# Patient Record
Sex: Female | Born: 1965 | Race: Black or African American | Hispanic: No
Health system: Southern US, Community
[De-identification: ages and names within clinical notes are randomized; demographics above are authoritative.]

---

## 2007-09-15 ENCOUNTER — Inpatient Hospital Stay
Admission: EM | Admit: 2007-09-15 | Disposition: A | Discharge status: Home / Self Care | Payer: Self-pay | Source: Emergency Department | Admitting: Emergency Medical Services

## 2007-09-15 LAB — CBC AND DIFFERENTIAL
Basophils Absolute: 0 /mm3 (ref 0.0–0.2)
Basophils: 0 % (ref 0–2)
Eosinophils Absolute: 0.1 /mm3 (ref 0.0–0.7)
Eosinophils: 1 % (ref 0–5)
Granulocytes Absolute: 3 /mm3 (ref 1.8–8.1)
Hematocrit: 40.2 % (ref 37.0–47.0)
Hgb: 13.2 G/DL (ref 12.0–16.0)
Immature Granulocytes Absolute: 0 CUMM (ref 0.0–0.0)
Immature Granulocytes: 0 % (ref 0–1)
Lymphocytes Absolute: 1 /mm3 (ref 0.5–4.4)
Lymphocytes: 22 % (ref 15–41)
MCH: 28 PG (ref 28.0–32.0)
MCHC: 32.8 G/DL (ref 32.0–36.0)
MCV: 85.2 FL (ref 80.0–100.0)
MPV: 9.9 FL (ref 9.4–12.3)
Monocytes Absolute: 0.3 /mm3 (ref 0.0–1.2)
Monocytes: 7 % (ref 0–11)
Neutrophils %: 69 % (ref 52–75)
Platelets: 230 /mm3 (ref 140–400)
RBC: 4.72 /mm3 (ref 4.20–5.40)
RDW: 14.1 % (ref 11.5–15.0)
WBC: 4.25 /mm3 (ref 3.50–10.80)

## 2007-09-15 LAB — BASIC METABOLIC PANEL
BUN: 12 MG/DL (ref 7–21)
CO2: 29 MEQ/L (ref 22–31)
Calcium: 9.3 MG/DL (ref 8.6–10.2)
Chloride: 102 MEQ/L (ref 98–107)
Creatinine: 0.8 MG/DL (ref 0.5–1.4)
Glucose: 95 MG/DL (ref 65–110)
Potassium: 4.1 MEQ/L (ref 3.6–5.0)
Sodium: 139 MEQ/L (ref 136–143)

## 2007-09-15 LAB — HEPATIC FUNCTION PANEL
ALT: 23 U/L (ref 7–56)
AST (SGOT): 26 U/L (ref 5–40)
Albumin/Globulin Ratio: 1.4 (ref 1.1–1.8)
Albumin: 4.3 G/DL (ref 3.7–5.1)
Alkaline Phosphatase: 83 U/L (ref 43–122)
Bilirubin Direct: 0 MG/DL — AB (ref 0.0–0.3)
Bilirubin Indirect: 0.5 MG/DL (ref 0.0–1.1)
Bilirubin, Total: 0.5 MG/DL (ref 0.2–1.3)
Globulin: 3.1 G/DL (ref 2.0–3.7)
Protein, Total: 7.4 G/DL (ref 6.0–8.0)

## 2007-09-15 LAB — CREATINE KINASE W/O REFLEX (SOFT): Creatine Kinase (CK): 108 U/L (ref 20–140)

## 2007-09-15 LAB — TROPONIN I QUANTITATIVE LEVEL - IFOH CERNER
Troponin I Quant: 0.01 NG/ML — AB
Troponin I Quant: <0.01 NG/ML

## 2007-09-15 LAB — CK: Creatine Kinase (CK): 113 U/L (ref 20–140)

## 2007-09-15 LAB — PT AND APTT
PT INR: 1.1 {INR} (ref 0.9–1.1)
PT: 12.9 s (ref 10.8–13.3)
PTT: 24 s (ref 21–32)

## 2007-09-15 LAB — GFR

## 2007-09-15 LAB — LIPASE: Lipase: 89 U/L (ref 23–300)

## 2007-09-15 LAB — HCG, SERUM, QUALITATIVE: Hcg Qualitative: NEGATIVE

## 2007-09-16 LAB — TROPONIN I QUANTITATIVE LEVEL - IFOH CERNER: Troponin I Quant: <0.01 NG/ML

## 2007-09-16 LAB — LIPID PANEL
Cholesterol: 109 MG/DL (ref ?–200)
HDL: 47 mg/dL (ref 40–60)
LDL Calculated: 50 mg/dL (ref ?–100)
Triglycerides: 61 MG/DL (ref ?–150)
VLDL Calculated: 12 mg/dL (ref ?–30)

## 2007-09-16 LAB — PT/INR
PT INR: 1.1 {INR} (ref 0.9–1.1)
PT: 13.1 s (ref 10.8–13.3)

## 2007-09-16 LAB — CREATINE KINASE W/O REFLEX (SOFT): Creatine Kinase (CK): 99 U/L (ref 20–140)

## 2007-09-17 LAB — CBC
Hematocrit: 36.5 % — ABNORMAL LOW (ref 37.0–47.0)
Hgb: 12 G/DL (ref 12.0–16.0)
MCH: 28 PG (ref 28.0–32.0)
MCHC: 32.9 G/DL (ref 32.0–36.0)
MCV: 85.1 FL (ref 80.0–100.0)
MPV: 9.8 FL (ref 9.4–12.3)
Platelets: 227 /mm3 (ref 140–400)
RBC: 4.29 /mm3 (ref 4.20–5.40)
RDW: 14.5 % (ref 11.5–15.0)
WBC: 4.28 /mm3 (ref 3.50–10.80)

## 2007-09-17 LAB — PT/INR
PT INR: 1.2 {INR} — ABNORMAL HIGH (ref 0.9–1.1)
PT: 14.1 s — ABNORMAL HIGH (ref 10.8–13.3)

## 2007-09-17 LAB — APTT: PTT: 38 s — ABNORMAL HIGH (ref 21–32)

## 2007-09-18 LAB — CBC
Hematocrit: 36.2 % — ABNORMAL LOW (ref 37.0–47.0)
Hgb: 11.9 G/DL — ABNORMAL LOW (ref 12.0–16.0)
MCH: 28.1 PG (ref 28.0–32.0)
MCHC: 32.9 G/DL (ref 32.0–36.0)
MCV: 85.4 FL (ref 80.0–100.0)
MPV: 9.7 FL (ref 9.4–12.3)
Platelets: 226 /mm3 (ref 140–400)
RBC: 4.24 /mm3 (ref 4.20–5.40)
RDW: 14.5 % (ref 11.5–15.0)
WBC: 3.17 /mm3 — ABNORMAL LOW (ref 3.50–10.80)

## 2007-09-18 LAB — PT/INR
PT INR: 1.6 {INR} — ABNORMAL HIGH (ref 0.9–1.1)
PT: 17.7 s — ABNORMAL HIGH (ref 10.8–13.3)

## 2007-09-18 LAB — APTT: PTT: 35 s — ABNORMAL HIGH (ref 21–32)

## 2007-09-19 LAB — CBC
Hematocrit: 36.2 % — ABNORMAL LOW (ref 37.0–47.0)
Hgb: 11.8 G/DL — ABNORMAL LOW (ref 12.0–16.0)
MCH: 28.2 PG (ref 28.0–32.0)
MCHC: 32.6 G/DL (ref 32.0–36.0)
MCV: 86.4 FL (ref 80.0–100.0)
MPV: 9.8 FL (ref 9.4–12.3)
Platelets: 222 /mm3 (ref 140–400)
RBC: 4.19 /mm3 — ABNORMAL LOW (ref 4.20–5.40)
RDW: 14.7 % (ref 11.5–15.0)
WBC: 3.96 /mm3 (ref 3.50–10.80)

## 2007-09-19 LAB — BASIC METABOLIC PANEL
BUN: 14 MG/DL (ref 7–21)
CO2: 29 MEQ/L (ref 22–31)
Calcium: 8.7 MG/DL (ref 8.6–10.2)
Chloride: 106 MEQ/L (ref 98–107)
Creatinine: 0.8 MG/DL (ref 0.5–1.4)
Glucose: 86 MG/DL (ref 65–110)
Potassium: 4.3 MEQ/L (ref 3.6–5.0)
Sodium: 138 MEQ/L (ref 136–143)

## 2007-09-19 LAB — GFR

## 2007-09-19 LAB — APTT: PTT: 40 s — ABNORMAL HIGH (ref 21–32)

## 2007-09-19 LAB — PHOSPHORUS: Phosphorus: 3.3 MG/DL (ref 2.5–4.5)

## 2007-09-19 LAB — MAGNESIUM: Magnesium: 1.9 MG/DL (ref 1.6–2.3)

## 2007-09-19 LAB — PT/INR
PT INR: 2.2 {INR} — ABNORMAL HIGH (ref 0.9–1.1)
PT: 23.3 s — ABNORMAL HIGH (ref 10.8–13.3)

## 2010-11-12 LAB — ECG 12-LEAD
Atrial Rate: 68 {beats}/min
P Axis: 60 degrees
P-R Interval: 158 ms
Q-T Interval: 394 ms
QRS Duration: 84 ms
QTC Calculation (Bezet): 418 ms
R Axis: -4 degrees
T Axis: 51 degrees
Ventricular Rate: 68 {beats}/min

## 2010-11-29 NOTE — Discharge Summary (Signed)
Account Number: [id number]      Document ID: [id number]      Admit Date: 09/15/2007      Discharge Date: 09/19/2007            ATTENDING PHYSICIAN:  Colin Mulders, MD                  DISCHARGE DIAGNOSES:      1.  Chest pain.      2.  Pulmonary embolus.      3.  Cholecystitis and cholelithiasis.      4.  Lupus.      5.  Diarrhea.            CONSULTATIONS:      Dr. Einar Crow from surgery, Dr. Joannie Springs from hematology-oncology,      Dr. Marinell Blight from cardiovascular, and Dr. Smiley Houseman from      pulmonology.            PROCEDURES PERFORMED:      1.  On August 5, chest CT.  Impression:  Filling defect within the right      lower lobe segmental pulmonary arterial branch most compatible with      pulmonary embolus.  There are no prior examinations for comparison;      however, given the history of previous pulmonary embolus, it is uncertain      if this is acute or sequelae of previous.  Large gallstone within the neck      of the gallbladder with questionable gallbladder wall thickening at the      fundus.      2.  Venous Doppler of lower extremities.  Impression:  No DVT involving      either extremity.      3.  Sonogram of the abdomen.  Impression:  Cholelithiasis with gallbladder      wall thickening and pericholecystic fluid consistent of cholecystitis.      4.  HIDA scan.  Impression:  Nonvisualization of the gallbladder compatible      with cystic duct obstruction and/or acute cholecystitis, mild enterogastric      bile reflux.      5.  Microbiology studies.  C. diff. toxin has been collected and is pending      at the time of this dictation.            HISTORY OF PRESENT ILLNESS:      The patient is a 45 year old female with a past medical history of obesity,      lupus, pulmonary embolus, DVT with the first 1 at the age of 89 who      presented to the emergency department complaining of chest pain that awoke      her in the middle of the night.  She described it as a diffuse pressure      which was  constant, decreased slightly and then recurred.  For a complete      and detailed history and physical, please refer to the dictation done by      Dr. Colin Mulders on September 15, 2007.            HOSPITAL COURSE:      1.  Chest pain/pulmonary embolus.  The patient presented to the emergency      department and is on Coumadin therapy taking 15 mg per day with a      significantly subtherapeutic INR of 1.1.  She denies noncompliance with the  Coumadin, therefore, a hematology-oncology consult was obtained as well as      a pulmonology consult and a cardiology consult.  The patient was ruled out      for myocardial infarction.  EKG was normal.  Serial cardiac enzymes were      normal.  The patient was started on aspirin and monitored very closely.      She was given full dose Lovenox throughout her admission and, at the time      of discharge, her INR was 2.2.  She will not receive a Lovenox bridge, per      recommendations by hematology.  The patient has been instructed to have her      PT INR checked on Tuesday by her rheumatologist, Dr. Ree Edman who      practices in [place], [place] and [place], and should also follow up with      Dr. Welton Flakes from hematology for further following of her hypercoagulable      condition.      2.  Cholecystitis.  As was noticed on CT scan, the patient underwent      further testing with a HIDA scan and was found to have cholecystitis with      cholelithiasis.  A surgery consult was obtained and surgery will be planned      at a later date.  The patient was on IV Zosyn while admitted.  Discussion      with surgery prior to discharge and, at that time, no recommendation for      continuation of any antibiotics was recommended.      3.  Systemic lupus erythematosus.  The patient continues on Plaquenil and      prednisone and will follow up with her rheumatologist.      4.  Diarrhea.  The patient reported having 2 loose stools on the day prior      to discharge.  She was initially  held, on the morning of discharge, with      the possibility that she may have developed C. diff.; however, she did not      have any stools all day.  Therefore, she was discharged.  Just prior to her      discharge, she did have 1 loose bowel movement and a specimen was      collected.  This will be sent to the lab for analysis of Clostridium      difficile.  It is unlikely that these results will be back from the lab      until late Monday or Tuesday; however, the pattern does not fit that of a      C. diff.  infection.  Should this result be positive, the patient and the      patient's physician will be notified immediately.      5.  Pain control.  The patient is on OxyContin 60 mg daily and Percocet      p.r.n. for treatment of her chronic pain from lupus and does not require      any additional pain medication at the time of discharge.            DISCHARGE MEDICATIONS:      Coumadin 15 mg p.o. daily, Plaquenil 200 mg p.o. daily, prednisone 2.5 mg      p.o. daily, aspirin 325 mg daily and this should be taken as an      enteric-coated tablet, Claritin 10 mg p.o. daily, Flonase 2 sprays to  nares      b.i.d.  Note, the Claritin and Flonase were added during this admission as      the patient was complaining about allergy symptoms.  Percocet 5/325 1      tablet q.4 hours p.o. p.r.n., OxyContin 60 mg p.o. daily (patient unsure if      she takes this once a day or b.i.d.).            FOLLOWUP INSTRUCTIONS:      The patient has been instructed to follow up with general surgery, Dr. Einar Crow, in 1 to 2 weeks.  She has been instructed to follow up with Dr.      Welton Flakes in 1 week.  She has been instructed to follow up with her primary care      physician, Dr. Mady Haagensen, in 2 to 3 days.  She has been given a      prescription and instructed to follow up with Dr. Ree Edman, who is her      rheumatologist and follows her PT INRs and prescribes her Coumadin, on      Tuesday.  Cardiology has recommended the  patient perform an outpatient      exercise treadmill test as well as a 2D echo in the next 1 to 2 weeks.  The      patient has been instructed to have these tests and prescriptions for      followup are being given to the patient at the time of discharge.      Pulmonology did not recommend any outpatient follow up.            Please call with questions, [phone].                        Electronic Signing Provider      _______________________________     Date/Time Signed: _____________      Burnis Medin MD ([id number])            D:  09/19/2007 15:21 PM by Virl Son, NP [phone])      T:  09/20/2007 03:05 AM by [id number]          Everlean Cherry: [id number]) (Doc ID: [id number])                        XL:[id number] Marinell Blight MD      Smiley Houseman MD      Einar Crow MD      Mady Haagensen MD      Joannie Springs MD      Ree Edman MD

## 2010-11-29 NOTE — Consults (Signed)
Account Number: [id number]      Document ID: [id number]      Admit Date: 09/15/2007      Service Date: 09/16/2007            Patient Location: [id number]      Patient Type: I            CONSULTING PHYSICIAN: Jari Pigg MD            REFERRING PHYSICIAN: Windell Moment MD                  CONSULTING SERVICE:      Cardiology.            CHIEF COMPLAINT:      Chest discomfort.            HISTORY OF PRESENT ILLNESS:      The patient is a 45 year old female with cardiovascular risk factors of      obesity, who presents for evaluation of chest discomfort.  The patient has      a history of pulmonary embolism in July 2008 as well as a deep venous      thrombosis which actually initiated age 73.  She was diagnosed with lupus      in November 2006.  She comes in for the above.  She noted to have markedly      subtherapeutic INR, although she says she has been taking her Coumadin 15      mg a day.            The patient had a CT chest done earlier yesterday morning which revealed a      right lower lobe filling defect consistent with pulmonary embolism.  There      is also of a large gallstone at the neck of gallbladder with some mild      gallbladder wall thickening.            The patient says her discomfort started in the early morning hours of the      5th.  It was at about 3:00 in the morning when she woke up with a chest      pressure sensation.  She describes it as quite diffuse in nature.  It was      constant.  She thought it was gas.  It lasts for several hours.  It      eventually did seem to mitigate.  It then recurred; therefore, she came to      the emergency room.            PAST MEDICAL HISTORY:      1.  History of systemic lupus erythematosus November 2006.      2.  History of DVT at age 65.      3.  Pulmonary embolism July 2008, status post IVC filter procedure (done at      [facility]  4.  Cholelithiasis, recent diagnosis.      5.  Hysterectomy.            SOCIAL HISTORY:      Patient is a  nonsmoker, no alcohol use.            FAMILY HISTORY:      Negative for premature coronary artery disease.            REVIEW OF SYSTEMS:      Constitutional, head, eyes, ears, nose, mouth, throat, GI, GU,      musculoskeletal, skin, respiratory all  negative unless noted in HPI.            MEDICATIONS AT HOME:      1.  Prednisone 2.5 mg a day.      2.  Plaquenil.      3.  Coumadin 15 mg a day.            ALLERGIES:      No known drug allergies.            PHYSICAL EXAMINATION:      VITAL SIGNS:  Temperature 96.7, pulse 65 and regular, respiratory rate 20,      blood pressure 109/68, oximetry 100% on room air.      GENERAL:  Patient appears well-developed, well-nourished, is moderately      obese, does not appear in acute distress.  She is breathing comfortably,      sitting up in the chair.      HEENT:  Normocephalic.  There is no elevated jugular venous distention.      There is no carotid bruit.  She has a brisk carotid upstroke.      CARDIAC:  Normal S1 and S2.  There is specifically no murmur, no rub.      LUNGS:  Clear to auscultation without wheeze, rhonchi, rales.      ABDOMEN:  Soft, protuberant, positive bowel sounds.      EXTREMITIES:  There is moderate leg edema.      VASCULAR:  Equal radial pulses, 4/4.      NEUROLOGIC:  She is alert and oriented x3, mood and affect is normal.            LABORATORY AND DIAGNOSTIC DATA:      White blood cell count 4.2, hemoglobin 13.2, platelet count is 230.  Sodium      139, potassium 4.1, creatinine is 0.8.  ALT 23, AST is 26, troponins are      less 0.01 x3 with negative CKs.  Total cholesterol 109, triglycerides 61,      HDL 47, LDL 50, INR 1.1.  Negative hCG.            A CT chest as above.            EKG reveals a normal sinus rhythm with no acute ischemic changes.  No S1,      Q3, T3 pattern noted.            IMPRESSION:      1.  Chest discomfort, sounds atypical for cardiac ischemia given the long      duration of symptoms and constancy, does not sound like  pericarditis or      acute ischemia.      2.  Right lower lobe pulmonary embolism, presumably new diagnosis, though      history of PE before in July 2008.      3.  History of systemic lupus erythematosus.      4.  History of DVT, starting at age 77.      5.  Subtherapeutic INR, though patient claims compliance with medication of      Coumadin.  6.  Cholelithiasis.      7.  Patient has ruled out for myocardial infarction.      8.  Past hysterectomy.      9.  Obesity.            RECOMMENDATIONS:      1.  The patient has low probability for cardiac ischemia given the  character and duration of her symptom complex.      2.  I suspect more than likely to discomfort may be related to the presence      of pulmonary embolism.  I do not suspect this to be a gallstone equivalent.      3.  We will perform from a cardiac standpoint outpatient exercise treadmill      test as well as a 2-D echocardiogram in the next 1 to 2 weeks.  The 2D echo      will be to also assess for pulmonary hypertension in patients with lupus.      4.  The patient will also be having hematology evaluation in regards to her      possible Coumadin resistance.                        Electronic Signing Provider      _______________________________     Date/Time Signed: _____________      Jari Pigg MD ([id number])            D:  09/16/2007 10:13 AM by Jari Pigg, MD ([id number])      T:  09/16/2007 11:39 AM by [id number]          Everlean Cherry: [id number]) (Doc ID: [id number])                  cc:

## 2010-11-29 NOTE — H&P (Signed)
Account Number: [id number]      Document ID: [id number]      Admit Date: 09/15/2007            Patient Location: [id number]      Patient Type: I            ATTENDING PHYSICIAN: Colin Mulders, MD                  The patient is unassigned.            PRESENTING COMPLAINT:      Chest pain.            HISTORY OF PRESENT ILLNESS:      This is a pleasant 45 year old Megan [name] who is obese, has a history of blood      clots in the lower extremities, also a history of PE.  She is on Coumadin.      She presents today to the ER with chest pain which began this morning.  It      was persistent and did not resolve and the patient presented to the      emergency department.  She has had no fevers, no cough.  She said the pain      does not radiate; it spread a little bit from the left side to her mid      chest, but did not radiate to her back.  She had some shortness of breath      and some mild increased pain with breathing.  The patient has a past      history of lupus but no other medical problems.  She is very adamant that      she does take her Coumadin every day.  It is 15 mg daily; however, her INR      in the ER is 1.1.            SOCIAL HISTORY:      This patient is a nonsmoker.            FAMILY HISTORY:      Possible for blood clots in her father's side.            PAST MEDICAL HISTORY:      As above.            REVIEW OF SYSTEMS:      Negative on a full review of 10 systems, except for what is stated above.            PHYSICAL EXAMINATION:      VITAL SIGNS:  The patient is, I believe afebrile; however, no temperature      is documented.  Heart rate is 67, blood pressure 139/68, breathing 16,      satting 99% on 2 liters.      GENERAL:  The patient appears somewhat depressed and with a flat affect.      HEENT:  Her sclerae are anicteric.  Her oropharynx is clear.  There are no      ulcers, no lymphadenopathy.  Mucous membranes are moist.      LUNGS:  Clear but diminished bilaterally and distant due to her body      habitus.   No wheezing is appreciated.      CARDIOVASCULAR:  She has a regular rate and rhythm, no murmur, rub or      gallop.      ABDOMEN:  Positive bowel sounds, nontender.  Right upper quadrant is  nontender.  No Eulah Pont sign is elicited, no hepatosplenomegaly.      EXTREMITIES:  With trace edema in the ankles only.            LABORATORY AND DIAGNOSTIC DATA:      Her INR is 1.1.  CBC, chemistry and LFTs normal.  Chest CT shows a PE,      right lower lobe segment arterial branch; however, it is unclear if this is      acute or chronic. EKG is normal sinus rhythm.            ASSESSMENT:      1.  Chest pain in a 45 year old Megan [name] with lupus, pulmonary embolisms in      the past, who is on chronic Coumadin, comes in with a subtherapeutic INR:      Question whether this is a new pulmonary embolism with her subtherapeutic      INR.  Location of pain is not entirely consistent with the findings on the      chest CT; therefore, I am concerned given her lupus, her risk of      atherosclerosis is high.  We will rule her out for a myocardial infarction,      despite the electrocardiogram which is normal.  Check troponins q.8.  Start      an aspirin.      2.  Obtain hematology consult for possible Coumadin resistance.  In the      meantime, continue Coumadin and continue full dose Lovenox.      3.  Lupus:  Continue Plaquenil, prednisone.  Put patient on Protonix for      peptic ulcer disease prophylaxis on steroids.                        Electronic Signing Provider      _______________________________     Date/Time Signed: _____________      Tiburcio Pea MD ([id number])            D:  09/15/2007 11:13 AM by Tammi Sou. Tana Felts, MD ([id number])      T:  09/15/2007 11:47 AM by [id number]          Everlean Cherry: [id number]) (Doc ID: [id number])                  cc:

## 2010-11-29 NOTE — Consults (Signed)
Account Number: [id number]      Document ID: [id number]      Admit Date: 09/15/2007      Service Date: 09/16/2007            Patient Location: [id number]      Patient Type: I            CONSULTING PHYSICIAN: Clearnce Hasten MD            REFERRING PHYSICIAN:                  CONSULTING SERVICE:      Pulmonary.            REASON FOR CONSULTATION:      Pulmonary embolus.            HISTORY OF PRESENT ILLNESS:      The patient is a 45 year old obese black lady who has a prior history of      DVT at age 61 and a pulmonary embolus in July 2008.  She was admitted to      the ER complaining of chest pain in the middle of night.  She is described      it as a diffuse pressure sensation which was constant for a couple hours.      It did cool off a little bit, but then started recurring again.  She was      noted to be subtherapeutic on INR to 1.1.  She is followed by Dr. Sheryle Hail      over in [place] for her lupus and Dr. Sheryle Hail also monitors her anticoagulation.       She had a CT scan of the chest which revealed a filling defect in the      right lower lobe PA branch and a large gallstone was seen in the neck of      the gallbladder with some questionable thickening of the area.  She does      offer complaints of off and on abdominal pain with heartburn and feelings      of gas.  She has noted no stool changes, no appetite changes.            PAST MEDICAL HISTORY:      As above.  She was found to have lupus in November 2006 and had been now on      Plaquenil and prednisone.  She does have an IVC filter in place which was      put in [place] last July and she has had a hysterectomy.            SOCIAL HISTORY:      She has never ever smoked.  She does not drink alcohol.  She is employed by      [employer] and she has a son.            ALLERGIES:      None known.            MEDICATIONS:      At home are Coumadin 15 mg daily, Plaquenil daily and prednisone 2.5 mg      daily.  Since being admitted, she has been started on twice daily  Lovenox 1      mg/kg and an aspirin has been added.            FAMILY HISTORY:      There was no premature coronary disease.  There was clotting disorders on  her dad's side of the family.            REVIEW OF SYSTEMS:      CONSTITUTIONAL:  There have been no fevers, chills or ill symptoms.      HEENT:  She does complain of some postnasal drip and sinus drainage for the      past 2 weeks with clear drainage.      NEUROLOGIC:  There is no history of chronic headaches or stroke.      CARDIOVASCULAR:  She has never had an MI.  She does not usually have chest      pain and she has no hypertension.      RESPIRATORY:  There is no history of asthma or bronchitis.  She is somewhat      short of breath with activity which she attributes to her weight.      GASTROINTESTINAL:  She had been having problems with heartburn and gas.      She had been having abdominal pain off.      GENITOURINARY:  There has been no urinary symptoms.      ENDOCRINE:  There is no known diabetes or thyroid disorders.      HEMATOLOGIC:  There is no known blood dyscrasias.  She does have the lupus.      MUSCULOSKELETAL:  No arthritis.      INFECTIOUS DISEASE:  No resistant infections.  All other review of systems      is negative.            PHYSICAL EXAMINATION:      GENERAL:  She is somewhat obese.  She has her hair done up and her makeup      on.      NEUROLOGIC:  She is awake, alert and oriented.      PSYCHIATRIC:  She is smiling and friendly.      CARDIOVASCULAR:  She has a regular S1 and S2, there are no murmurs or rubs.      EXTREMITIES:  There is no edema.      VITAL SIGNS:  Her blood pressure is 109/68, heart rate 65, temperature      96.8, respiratory rate 20.      RESPIRATORY:  She is saturating 99% on room air.  There are no infiltrates      on her CT of the chest.      LUNGS:  Clear and equal.  There are no wheezes or rales.  She is ambulating      in the room freely without shortness of breath.  She does have some nasal      drainage  and there is a small amount of posterior pharyngeal nasal      drainage.      ABDOMEN:  She does have a large stone in the neck of the gallbladder on CAT      scan.  She has had a sonogram.  The results are pending.  Her abdomen is      obese.  She is nontender to palpation.            LABORATORY STUDIES:      Her white count is 4; hematocrit 40; platelets are 230.  Sodium 139;      potassium 4.1; CO2 29; BUN 12; creatinine 0.8; glucose was 95.  INR is 1.1.       Alkaline phosphatase is 83; CK was 113; troponin less than 0.01.      Pregnancy test  was negative.            ASSESSMENT AND PLAN:      1.  Admitted with chest pain, questionably secondary to recurrence of      pulmonary embolus.  She did have a subtherapeutic INR on admission versus      being secondary to gallstone pain versus being secondary to GE reflux.      Continuing her 15 mg of Coumadin twice daily, Lovenox has been added to      target an INR of 3.  Will request the last lab draws from Dr. Elias Else      office in [place] and if there are any further questions, the phone number there      is [phone].  Hematology is reportedly going to see her regarding      potential Coumadin resistance.  She has had abdominal sonogram to evaluate      that gallstone and surgery can be consulted if warranted and she should be      started on Protonix.  She has been encouraged to keep her head of bed up      and try to lose a little weight.      2.  History of lupus.  She is on and prednisone.      3.  Prior DVT at age 66.  She does have a filter in place and lower      extremity Dopplers are pending to evaluate for recurrence.                        Electronic Signing Provider      _______________________________     Date/Time Signed: _____________      Clearnce Hasten MD [phone])            D:  09/16/2007 12:22 PM by Kellie Shropshire, NP [phone])      T:  09/17/2007 04:46 AM by MDI = [id number]          (Conf: [id number]) (Doc ID: [id number])                  cc:

## 2010-11-29 NOTE — Consults (Signed)
Account Number: [id number]      Document ID: [id number]      Admit Date: 09/15/2007      Service Date: 09/16/2007            Patient Location: [id number]      Patient Type: I            CONSULTING PHYSICIAN: Acquanetta Sit MD            REFERRING PHYSICIAN: Tiburcio Pea MD                  CONSULTING SERVICE:      Hematology and oncology.            REASON FOR CONSULTATION:      Pulmonary embolus.            HISTORY OF PRESENT ILLNESS:      This is a 45 year old African American lady, very pleasant, a prior history      of lupus, history of DVT first time at the age of 72.  Also has had      pulmonary embolus and 2 other episodes of DVTs.  The patient mentioned that      her last PE was probably last year.  Patient was on Coumadin; however, she      started having epigastric and mid chest pain.  The patient came into the      ER.  A CT scan was done which indicated a pulmonary embolus in the right      lower lobe.            PAST MEDICAL HISTORY:      Significant for a history of lupus also, history of numerous thromboses.      The patient also had an IVC filter placed in [place] in July 2008.            SOCIAL HISTORY:      The patient is a Chartered certified accountant.  Has a son and a daughter.  Does not smoke.      Drinks socially.            FAMILY HISTORY:      Significant for one of her paternal aunts with blood clot.            PHYSICAL EXAMINATION:      GENERAL:  A very pleasant lady in no acute distress.      VITAL SIGNS:  At this time, she is afebrile.      HEART:  Regular.      LUNGS:  Clear.      ABDOMEN:  Distended.      EXTREMITIES:  There is no edema.            LABORATORY DATA:      White blood cell count of 4.25; hemoglobin of 13.2; hematocrit of 40.2;      platelet count of 230,000.  Sodium of 139; potassium of 4.1; chloride of      102; bicarbonate of 29; BUN is 12; creatinine is 0.2; glucose 95; PT of      12.9; INR of 1.1.            IMPRESSION:      Hypercoagulable condition, history of lupus.  The patient  came in with mid      epigastric and mid chest pain, right lower lobe PE.  INR is not      therapeutic.  PLAN:      Will go up Coumadin to 15 mg or even 20 should be fine.  Continue on      Lovenox.  Discussed with the patient that she may need a higher dose of      Coumadin if 15 Coumadin was not enough.  Some patients need a higher dose.      The patient will be followed back by her primary care physician in [place].      There is no point in hypercoagulable workup as she probably has had all of      this done in [place], [place]. or [place].  She needs anticoagulation.      Would need higher dose of Coumadin.  Discussed this with nurse      practitioner, Mike Craze.                        Electronic Signing Provider      _______________________________     Date/Time Signed: _____________      Acquanetta Sit MD [phone])            D:  09/17/2007 13:55 PM by Acquanetta Sit, MD [phone])      T:  09/17/2007 17:06 PM by [id number]          Everlean Cherry: [id number]) (Doc ID: [id number])                  cc:

## 2010-11-29 NOTE — Consults (Signed)
Account Number: [id number]      Document ID: [id number]      Admit Date: 09/15/2007      Service Date: 09/17/2007            Patient Location: [id number]      Patient Type: I            CONSULTING PHYSICIAN: Latanya Presser MD            REFERRING PHYSICIAN: Tiburcio Pea MD                  REASON FOR CONSULTATION:      Abnormal sonogram.            HISTORY OF PRESENT ILLNESS:      The patient is a 45 year old African-American woman who is obese and with a      significant past medical history of DVTs and PEs as well as lupus who was      brought in by ambulance to [facility]      yesterday with complaints of chest discomfort that woke her up from sleep      approximately at 3:00 in the morning prior to her arrival.  The patient      describes that her discomfort was located in the middle of her chest and      radiated slightly to the left.  She reports that her pain was persistent      and did not resolve and subsequently decided to come to the emergency      department.  She denies any specific right upper quadrant abdominal pain or      any radiation of her pain to her shoulder blades or back region.  She      reports having had some shortness of breath and some pleurisy with her      chest discomfort as well.  She denies any nausea or vomiting with onset of      her chest discomfort and also reports no acute changes to her bowel habits,      specifically denying any melena, hematochezia, diarrhea or constipation.      She denies any jaundice or any changes to the color of her skin, sclerae,      or urine.            Of note, the patient does report that she has been able to tolerate a      regular diet without any significant abdominal discomfort.  She does report      that occasionally she will have some nausea and emesis although this is not      triggered by food intake.  She also reports that occasionally she will      experience abdominal pain which is diffuse and not  localized to any      particular region in her abdomen.  She states that often when this happens      she just goes to be and the pain goes away.  Over the course of the last      year, she reports that she has been having occasional diarrhea as well as      occasional constipation but this does not bother her.            Given the patient's chest discomfort, she was brought into the hospital for      additional observation and management.  While here, she underwent a CT scan  of the chest which revealed the presence of pulmonary embolus and she has      been started on anticoagulation therapy.  On the CT of her chest, the      patient was seen to have  a stone at the gallbladder neck and a followup      ultrasound was performed which revealed gallbladder wall thickening.  We      have been consequently consulted for this particular reason.            PAST MEDICAL HISTORY:      As stated above is significant for DVTs, PE, lupus, and gallstones.            PAST SURGICAL HISTORY:      The patient is status post hysterectomy.  She is also status post IVC      filter insertion which was done in [place].            MEDICATIONS:      At home include Coumadin, Plaquenil, prednisone.            ALLERGIES:      No known drug allergies.            FAMILY HISTORY:      Noncontributory.            SOCIAL HISTORY:      The patient denies any tobacco use, alcohol or recreational drug use.            REVIEW OF SYSTEMS:      As per the HPI.  The patient denies any additional changes to her otic,      ophthalmologic, dermatologic, pulmonary, cardiovascular, genitourinary,      musculoskeletal, neurologic, endocrinologic, hematologic, or psychiatric      systems.            PHYSICAL EXAMINATION:      GENERAL:  The patient is an obese female who is resting comfortably in her      hospital bed.  She is presently having lunch which consists of a steak and      cheese omelet and tolerating it without any discomfort.  She is alert  and      oriented x3 and is in no acute distress.      VITAL SIGNS:  Include a present temperature of 96.7 degrees, heart rate 65,      respiratory rate is 20, blood pressure is 109/68, and saturation is 100% on      room air.      HEENT:  Her head is normocephalic, atraumatic.  Her sclerae are anicteric.      Conjunctivae are pale, extraocular movement intact.  Nares are patent      without any masses or rhinorrhea.  Her ears are symmetrical bilaterally      without any hearing loss, buccal mucosa is pink and dry.  She has no      exudates on examination of her oropharynx.      NECK:  Supple without any thyromegaly and she has no palpable      lymphadenopathy present at this time.      LUNGS:  Clear to auscultation bilaterally without any rales, rhonchi, or      wheezes.      HEART:  Has a regular rate and rhythm, S1, S2, without any murmurs, rubs or      gallops.  She has no JVD and no carotid bruits auscultated bilaterally.      ABDOMEN:  Soft.  It is not tender to palpation without any guarding,      rigidity, or rebound tenderness.  She has a negative Murphy sign present.      No palpable masses or organomegaly appreciable and she has positive      normoactive bowel sounds auscultated.      EXTREMITIES:  Without any clubbing, cyanosis, or edema and she has      bilateral pedal pulses 2+.            LABORATORY DATA:      I personally reviewed the patient's most recent laboratory studies which      includes a normal white count of 4.25, hemoglobin and hematocrit are within      normal limits at 13.2 and 40.2 respectively and a platelet count of 230.      Her chemistries are completely unremarkable.  She has a total bilirubin      level of 0.5 which is normal.  She has no abnormalities in her liver      function tests and no elevations in her amylase and lipase.  Review of the      patient's most recent CT scan of the chest performed yesterday does reveal      a filling defect within the right lower lobe segmental  pulmonary artery      compatible with pulmonary embolism.  She also had an ultrasound Doppler of      her lower extremities which revealed no evidence of DVT.  Ultrasound of the      abdomen was also completed today which revealed the presence of gallstones      with gallbladder wall thickening and pericholecystic fluid.            ASSESSMENT:      A 45 year old female with history of pulmonary embolus, DVT, and lupus who      presented to [facility] by ambulance yesterday with      complaints of chest discomfort in the mid to the left region of her chest      which woke her up in the middle of the night without any associated nausea,      vomiting or changes to her bowel habits.  She does report some associated      shortness of breath.  While in the hospital, she is found to have a      pulmonary embolus and additional studies have now shown the presence of a      gallstone with gallbladder wall thickening as well as pericholecystic fluid      suspicious for acute cholecystitis.            RECOMMENDATIONS:      1.  The patient's imaging studies at this time do not quite fit into her      clinical presentation.  She is presently afebrile with no right upper      quadrant abdominal tenderness on palpation as well with no changes to her      liver function tests and a normal bilirubin level.  Accordingly, we will      obtain a HIDA scan with [facility] tomorrow morning in order to evaluate the      patient further and determine whether there is cystic duct obstruction      present.      2.  For the time, continuing anticoagulation therapy for her pulmonary      embolus.      3.  Will make the patient n.p.o. after midnight in preparation for HIDA      scan.      4.  Will hold the patient's narcotic medications in preparation for her      HIDA scan as this can often cause false-positive results in the HIDA scan.      5.  Will await the patients findings from her additional studies in order      to make further  recommendations.            Thank you for allowing [place] to participate in the care of this very nice      lady.  The case was discussed with Dr. Einar Crow who also had the      privilege to interview and examine the patient himself and who agreed to      the recommendations outlined above.                        Electronic Signing Provider      _______________________________     Date/Time Signed: _____________      Latanya Presser MD [phone])            D:  09/17/2007 17:35 PM by Theodis Sato, PA ([id number])      T:  09/17/2007 17:43 PM by CP          Everlean Cherry: [id number]) (Doc ID: [id number])                  [id number] Marinell Blight MD      Smiley Houseman MD

## 2014-11-08 ENCOUNTER — Other Ambulatory Visit: Payer: Self-pay | Admitting: Critical Care Medicine

## 2014-11-08 DIAGNOSIS — R011 Cardiac murmur, unspecified: Secondary | ICD-10-CM

## 2014-11-08 DIAGNOSIS — Z01810 Encounter for preprocedural cardiovascular examination: Secondary | ICD-10-CM

## 2014-11-14 ENCOUNTER — Ambulatory Visit
Admission: RE | Admit: 2014-11-14 | Discharge: 2014-11-14 | Disposition: A | Discharge status: Home / Self Care | Payer: BC Managed Care – PPO | Source: Ambulatory Visit | Attending: Critical Care Medicine | Admitting: Critical Care Medicine

## 2014-11-14 DIAGNOSIS — R011 Cardiac murmur, unspecified: Secondary | ICD-10-CM | POA: Insufficient documentation

## 2014-11-14 DIAGNOSIS — Z01810 Encounter for preprocedural cardiovascular examination: Secondary | ICD-10-CM | POA: Insufficient documentation

## 2014-11-15 ENCOUNTER — Telehealth (INDEPENDENT_AMBULATORY_CARE_PROVIDER_SITE_OTHER): Payer: Self-pay

## 2014-11-15 NOTE — Telephone Encounter (Signed)
-----   Message from Baldwin Crown, MD sent at 11/14/2014  6:03 PM EDT -----  Regarding: echo  Please inform patient of satisfactory results     Pleas send report to Amado Coe, M.D. also

## 2014-11-15 NOTE — Telephone Encounter (Signed)
LM for patient to call office

## 2014-11-16 NOTE — Telephone Encounter (Signed)
LM for patient to call office

## 2014-11-17 NOTE — Telephone Encounter (Signed)
Pt advised of results. Expressed understanding.

## 2015-01-23 ENCOUNTER — Telehealth (INDEPENDENT_AMBULATORY_CARE_PROVIDER_SITE_OTHER): Payer: Self-pay | Admitting: Cardiovascular Disease

## 2015-01-23 NOTE — Telephone Encounter (Signed)
Faxed echo report to dr Charlynne Pander [phone]    Fax conf rec'd

## 2016-03-03 ENCOUNTER — Ambulatory Visit (INDEPENDENT_AMBULATORY_CARE_PROVIDER_SITE_OTHER): Payer: BLUE CROSS/BLUE SHIELD

## 2016-03-03 ENCOUNTER — Ambulatory Visit
Admission: EM | Admit: 2016-03-03 | Discharge: 2016-03-03 | Disposition: A | Discharge status: Home / Self Care | Payer: BLUE CROSS/BLUE SHIELD | Attending: Family Medicine | Admitting: Family Medicine

## 2016-03-03 DIAGNOSIS — S39012A Strain of muscle, fascia and tendon of lower back, initial encounter: Secondary | ICD-10-CM

## 2016-03-03 DIAGNOSIS — S161XXA Strain of muscle, fascia and tendon at neck level, initial encounter: Secondary | ICD-10-CM

## 2016-03-03 MED ORDER — HYDROCODONE-ACETAMINOPHEN 5-325 MG PO TABS: ORAL_TABLET | ORAL | 0 refills | Status: AC

## 2016-03-03 MED ORDER — CYCLOBENZAPRINE HCL 10 MG PO TABS: 10.0000 mg | ORAL_TABLET | Freq: Every day | ORAL | 0 refills | Status: AC
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
## 2017-07-02 ENCOUNTER — Other Ambulatory Visit (INDEPENDENT_AMBULATORY_CARE_PROVIDER_SITE_OTHER): Payer: Self-pay | Admitting: Internal Medicine

## 2017-07-03 LAB — COMPREHENSIVE METABOLIC PANEL
ALT: 13 U/L (ref 10–50)
AST (SGOT): 20 U/L (ref 0–40)
African American eGFR: 83.02
Albumin: 4.4 g/dL (ref 3.5–5.2)
Alkaline Phosphatase: 66 U/L (ref 35–130)
BUN / Creatinine Ratio: 12.2
BUN: 11 mg/dL (ref 6–23)
Bilirubin, Total: 0.2 mg/dL (ref 0.0–1.2)
CO2: 21 mmol/L (ref 19–31)
Calcium: 9.1 mg/dL (ref 8.6–10.2)
Chloride: 105 mEq/l (ref 97–107)
Creatinine: 0.88 mg/dL (ref 0.50–1.00)
Glucose: 92 mg/dL (ref 74–106)
Potassium: 3.6 mmol/L (ref 3.5–5.1)
Protein, Total: 7.24 g/dL (ref 6.30–8.70)
Sodium: 141 mmol/L (ref 135–145)
non-African American eGFR: 68.61

## 2017-07-03 LAB — LIPID PANEL
Cholesterol / HDL Ratio: 2.2 (ref 0.0–3.6)
Cholesterol: 146 mg/dL (ref 0–200)
HDL: 66 mg/dL (ref >40)
LDL Calculated: 72 mg/dL (ref 0–129)
NON HDL CHOLESTEROL: 81 mg/dL
Triglycerides: 40 mg/dL (ref 0–150)

## 2017-09-17 IMAGING — CR DG CERVICAL SPINE COMPLETE 4+V
6 series · 6 of 6 positions shown · non-contrast
Comparison: None.

CLINICAL DATA: Recent fall with neck pain, initial encounter

EXAM:
CERVICAL SPINE - COMPLETE 4+ VIEW

[c-spine lat]
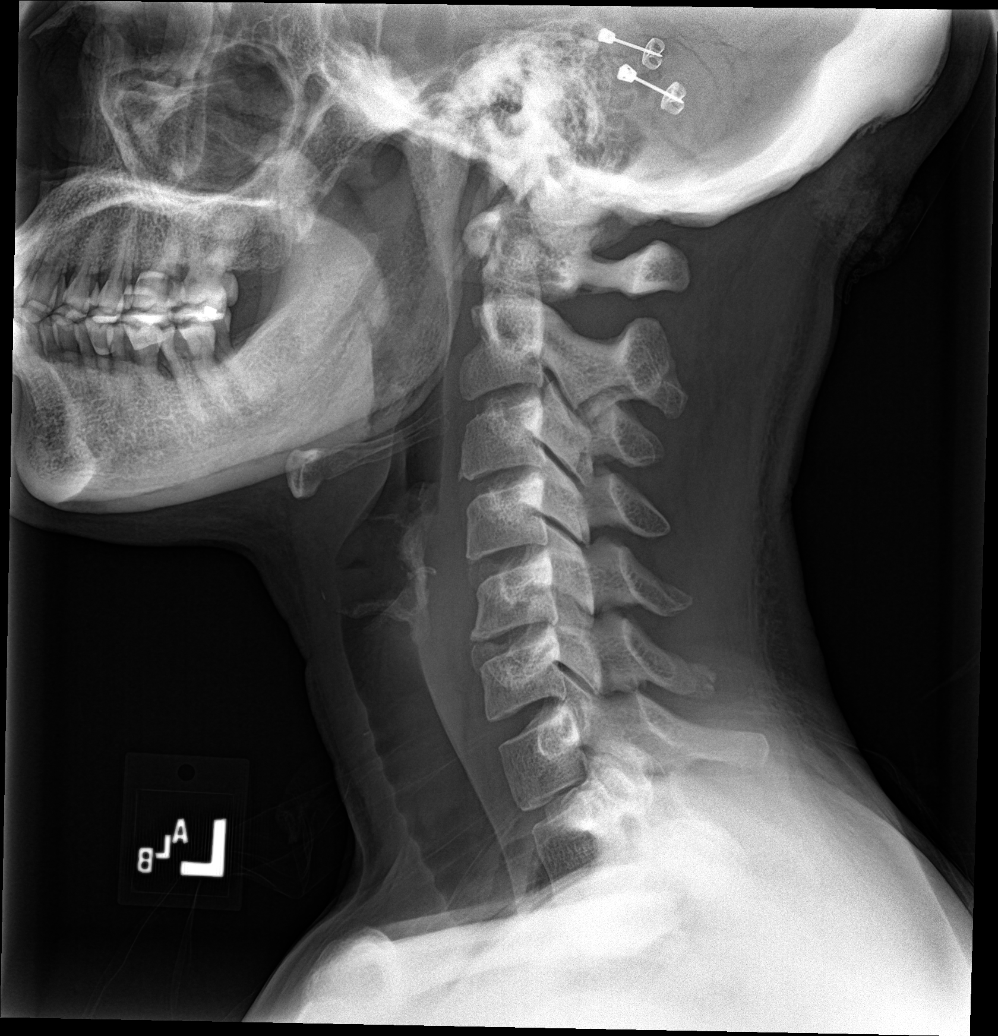

[c-spine obl (1 of 2)]
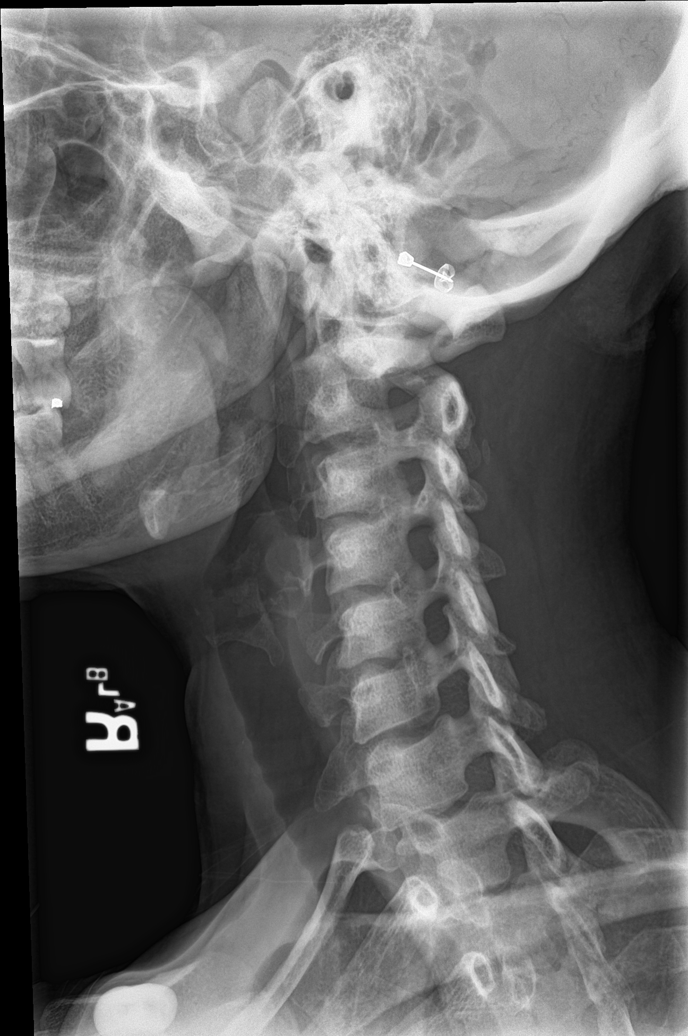

[c-spine obl (2 of 2)]
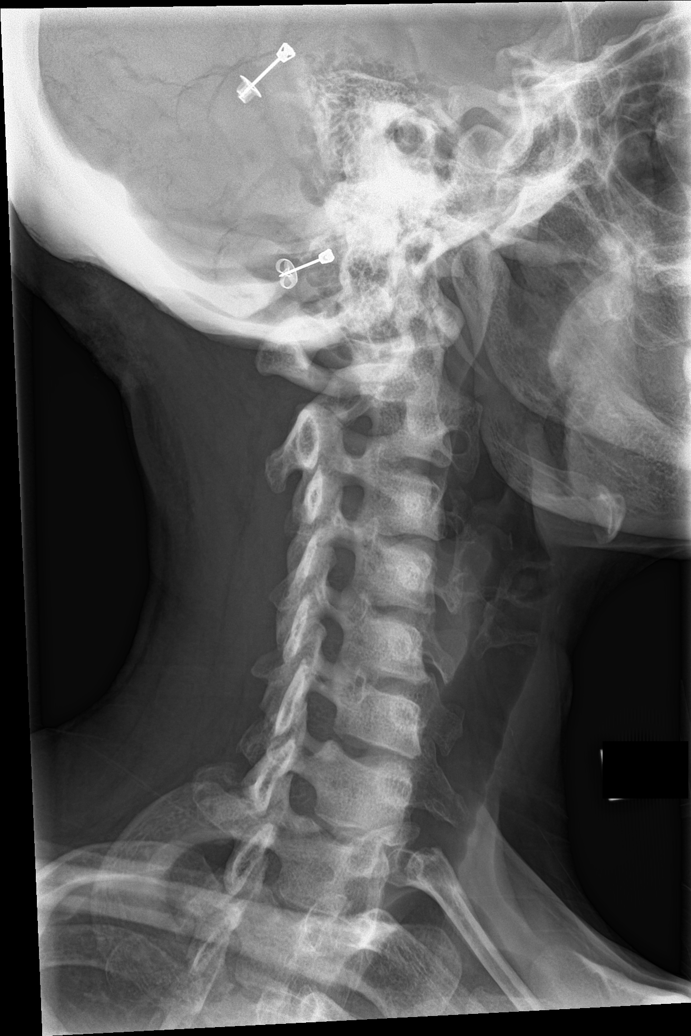

[c-spine ap]
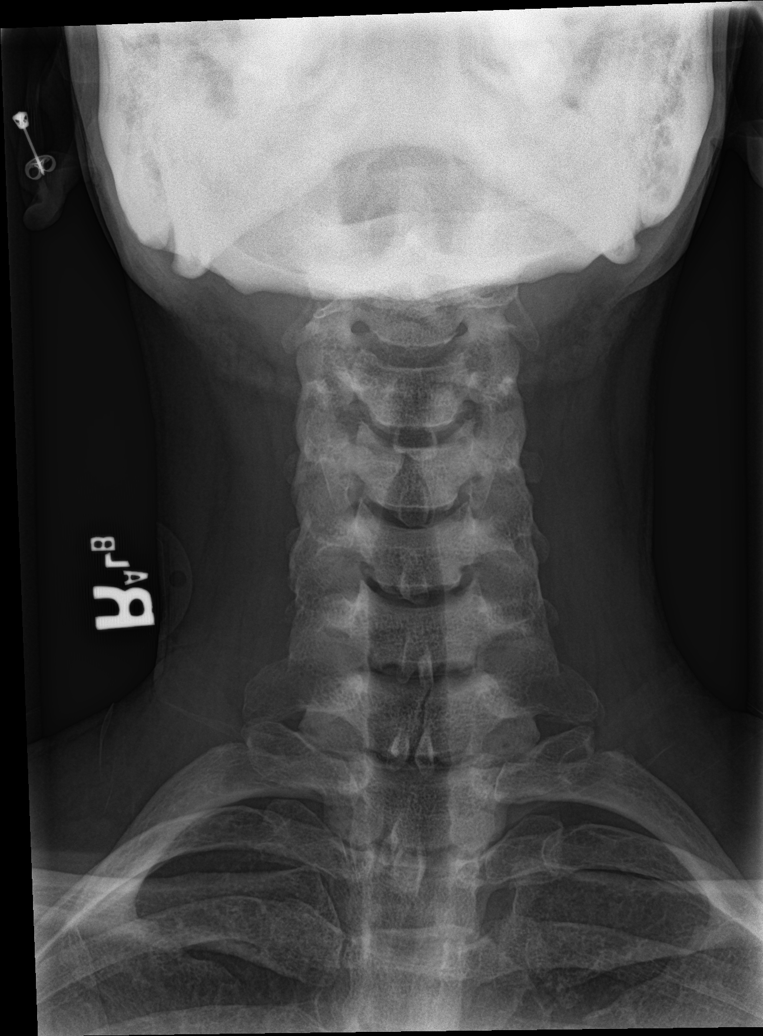

[c-spine open mouth]
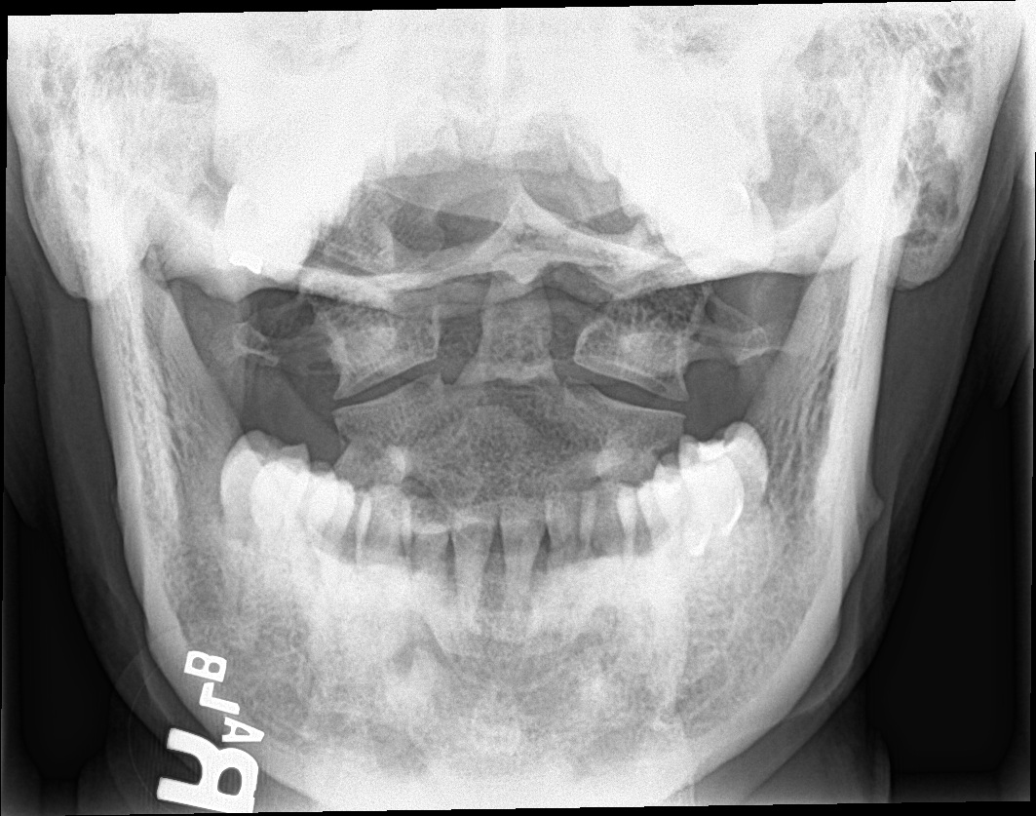

[[person_name]]
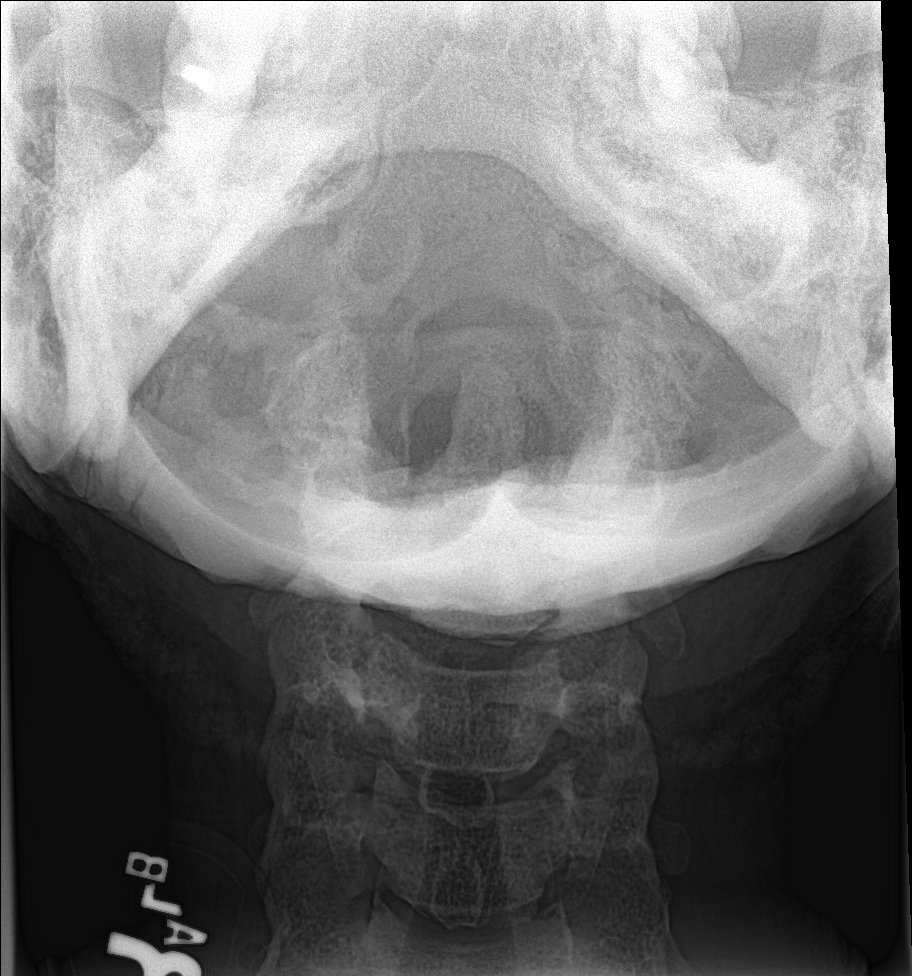

[6 of 6 positions shown; findings below may reference images not displayed]

FINDINGS: There is no evidence of cervical spine fracture or prevertebral soft
tissue swelling. Alignment is normal. No other significant bone
abnormalities are identified.
IMPRESSION: No acute abnormality noted.
